# Patient Record
Sex: Female | Born: 2000 | Race: White | Hispanic: No | Marital: Single | Smoking: Never smoker
Health system: Southern US, Community
[De-identification: ages and names within clinical notes are randomized; demographics above are authoritative.]

## PROBLEM LIST (undated history)

## (undated) DIAGNOSIS — H539 Unspecified visual disturbance: Secondary | ICD-10-CM

## (undated) DIAGNOSIS — F410 Panic disorder [episodic paroxysmal anxiety] without agoraphobia: Secondary | ICD-10-CM

## (undated) DIAGNOSIS — F1910 Other psychoactive substance abuse, uncomplicated: Secondary | ICD-10-CM

## (undated) DIAGNOSIS — F419 Anxiety disorder, unspecified: Secondary | ICD-10-CM

## (undated) DIAGNOSIS — T7840XA Allergy, unspecified, initial encounter: Secondary | ICD-10-CM

## (undated) DIAGNOSIS — F32A Depression, unspecified: Secondary | ICD-10-CM

## (undated) DIAGNOSIS — F329 Major depressive disorder, single episode, unspecified: Secondary | ICD-10-CM

## (undated) DIAGNOSIS — Z68.41 Body mass index (BMI) pediatric, greater than or equal to 95th percentile for age: Secondary | ICD-10-CM

## (undated) HISTORY — DX: Anxiety disorder, unspecified: F41.9

## (undated) HISTORY — DX: Panic disorder (episodic paroxysmal anxiety): F41.0

## (undated) HISTORY — DX: Allergy, unspecified, initial encounter: T78.40XA

## (undated) HISTORY — PX: TONSILLECTOMY: SUR1361

## (undated) HISTORY — DX: Unspecified visual disturbance: H53.9

## (undated) HISTORY — DX: Body mass index (bmi) pediatric, greater than or equal to 95th percentile for age: Z68.54

---
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
## 2022-01-01 ENCOUNTER — Ambulatory Visit (INDEPENDENT_AMBULATORY_CARE_PROVIDER_SITE_OTHER): Payer: Medicaid Other

## 2022-01-01 ENCOUNTER — Ambulatory Visit
Admission: EM | Admit: 2022-01-01 | Discharge: 2022-01-01 | Disposition: A | Discharge status: Home / Self Care | Payer: Medicaid Other

## 2022-01-01 ENCOUNTER — Encounter: Payer: Self-pay | Admitting: Emergency Medicine

## 2022-01-01 DIAGNOSIS — S56911A Strain of unspecified muscles, fascia and tendons at forearm level, right arm, initial encounter: Secondary | ICD-10-CM

## 2022-01-01 DIAGNOSIS — M79601 Pain in right arm: Secondary | ICD-10-CM
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
## 2022-01-01 NOTE — Discharge Instructions (Addendum)
Your x-rays are negative for fracture or dislocation.   May take over-the-counter ibuprofen or Tylenol for pain or discomfort. RICE therapy rest, ice, compression, and elevation until your symptoms improve.  Apply ice for 20 minutes, remove for 1 hour, then repeat as often as possible.  This will help with pain and swelling. If symptoms do not improve within the next 2 to 4 weeks, recommend following up with orthopedics.  You can follow-up with [facility] at [phone] or [facility] in [place] at [phone].
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
## 2022-02-14 ENCOUNTER — Ambulatory Visit: Payer: Self-pay

## 2022-02-14 ENCOUNTER — Ambulatory Visit
Admission: EM | Admit: 2022-02-14 | Discharge: 2022-02-14 | Disposition: A | Discharge status: Home / Self Care | Payer: Medicaid Other | Attending: Family Medicine | Admitting: Family Medicine

## 2022-02-14 DIAGNOSIS — M79641 Pain in right hand: Secondary | ICD-10-CM

## 2022-02-14 MED ORDER — CYCLOBENZAPRINE HCL 10 MG PO TABS: 10.0000 mg | ORAL_TABLET | Freq: Three times a day (TID) | ORAL | 0 refills | Status: AC | PRN

## 2022-02-14 MED ORDER — PREDNISONE 20 MG PO TABS: 40.0000 mg | ORAL_TABLET | Freq: Every day | ORAL | 0 refills | Status: AC

## 2022-02-14 NOTE — ED Triage Notes (Signed)
Pt presents with pain in palm of right hand for past week, denies injury reports uses a lot at new job

## 2022-02-18 NOTE — ED Provider Notes (Signed)
[facility]    CSN: [id number] Arrival date & time: 02/14/22  1903      History   Chief Complaint Chief Complaint  Patient presents with   Hand Injury    HPI Dawn Pineda is a 21 y.o. female.   Patient presenting today with pain in her right palm for about a week now.  Denies any direct injury to the area but states she does a lot of repetitive movement with his hand at work.  Denies numbness, tingling, decreased range of motion, swelling, discoloration.  So far trying over-the-counter pain relievers with minimal relief.    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      Home Medications    Prior to Admission medications   Medication Sig Start Date End Date Taking? Authorizing Provider  cyclobenzaprine (FLEXERIL) 10 MG tablet Take 1 tablet (10 mg total) by mouth 3 (three) times daily as needed for muscle spasms. Do not drink alcohol or drive while taking this medication.  May cause drowsiness. 02/14/22  Yes Particia Nearing, PA-C  predniSONE (DELTASONE) 20 MG tablet Take 2 tablets (40 mg total) by mouth daily with breakfast. 02/14/22  Yes Particia Nearing, PA-C  amphetamine-dextroamphetamine (ADDERALL) 30 MG tablet Take 30 mg by mouth daily.    [provider]  escitalopram (LEXAPRO) 5 MG tablet Take 5 mg by mouth daily.    [provider]  ibuprofen (ADVIL,MOTRIN) 800 MG tablet Take 1  tablet (800 mg total) by mouth 3 (three) times daily. Patient not taking: Reported on 07/08/2015 01/14/15   Burgess Amor, PA-C    Family History Family History  Problem Relation Age of Onset   Alcohol abuse Mother    ADD / ADHD Father    Alcohol abuse Father    ADD / ADHD Brother    Asthma Brother    Cancer Maternal Aunt    Melanoma Maternal Aunt        died at age 44   COPD Maternal Grandmother    Diabetes Paternal Grandmother    Hyperlipidemia Paternal Grandmother    Diabetes Paternal Grandfather    Hypertension Paternal Grandfather    Hyperlipidemia Paternal Grandfather     Social History Social History   Tobacco Use   Smoking status: Never  Substance Use Topics   Alcohol use: No   Drug use: Yes    Types: Marijuana    Comment: hx of cough syrup/decongestant use as child     Allergies   Eye drops [tetrahydrozoline]   Review of Systems Review of Systems Per HPI  Physical Exam Triage Vital Signs ED Triage Vitals [02/14/22 1951]  Enc Vitals Group     BP 118/80     Pulse Rate 73     Resp 16     Temp (!) 97.5 F (36.4 C)  Temp Source Oral     SpO2 99 %     Weight      Height      Head Circumference      Peak Flow      Pain Score 8     Pain Loc      Pain Edu?      Excl. in GC?    No data found.  Updated Vital Signs BP 118/80 (BP Location: Right Arm)   Pulse 73   Temp (!) 97.5 F (36.4 C) (Oral)   Resp 16   SpO2 99%   Visual Acuity Right Eye Distance:   Left Eye Distance:   Bilateral Distance:    Right Eye Near:   Left Eye Near:    Bilateral Near:     Physical Exam Vitals and nursing note reviewed.  Constitutional:      Appearance: Normal appearance. She is not ill-appearing.  HENT:     Head: Atraumatic.  Eyes:     Extraocular Movements: Extraocular movements intact.     Conjunctiva/sclera: Conjunctivae normal.  Cardiovascular:     Rate and Rhythm: Normal rate and regular rhythm.     Heart sounds: Normal heart sounds.   Pulmonary:     Effort: Pulmonary effort is normal.     Breath sounds: Normal breath sounds.  Musculoskeletal:        General: Tenderness present. No swelling, deformity or signs of injury. Normal range of motion.     Cervical back: Normal range of motion and neck supple.     Comments: Localized area of tenderness to palpation below third and fourth digits right hand.  No bony deformity palpable, range of motion full and intact  Skin:    General: Skin is warm and dry.  Neurological:     Mental Status: She is alert and oriented to person, place, and time.     Motor: No weakness.     Gait: Gait normal.     Comments: Right hand neurovascularly intact  Psychiatric:        Mood and Affect: Mood normal.        Thought Content: Thought content normal.        Judgment: Judgment normal.      UC Treatments / Results  Labs (all labs ordered are listed, but only abnormal results are displayed) Labs Reviewed - No data to display  EKG   Radiology No results found.  Procedures Procedures (including critical care time)  Medications Ordered in UC Medications - No data to display  Initial Impression / Assessment and Plan / UC Course  I have reviewed the triage vital signs and the nursing notes.  Pertinent labs & imaging results that were available during my care of the patient were reviewed by me and considered in my medical decision making (see chart for details).     Suspect a tendinitis type issue, treat with prednisone, Flexeril, rest, Epsom salt soaks, over-the-counter pain relievers.  X-ray imaging deferred with shared decision making given no injury to the area and no deformities on palpation, range of motion being intact.  Work note given for rest.  Return for any worsening symptoms.  Final Clinical Impressions(s) / UC Diagnoses   Final diagnoses:  Right hand pain   Discharge Instructions   None    ED Prescriptions     Medication Sig Dispense Auth. Provider    predniSONE (DELTASONE) 20 MG tablet Take 2 tablets (40 mg total) by mouth daily with breakfast. 6  tablet Particia Nearing, PA-C   cyclobenzaprine (FLEXERIL) 10 MG tablet Take 1 tablet (10 mg total) by mouth 3 (three) times daily as needed for muscle spasms. Do not drink alcohol or drive while taking this medication.  May cause drowsiness. 15 tablet Particia Nearing, [place]      PDMP not reviewed this encounter.   Particia Nearing, [place] 02/18/22 1948

## 2022-06-09 ENCOUNTER — Ambulatory Visit: Admit: 2022-06-09 | Payer: Medicaid Other

## 2024-02-14 ENCOUNTER — Ambulatory Visit (HOSPITAL_COMMUNITY): Payer: Self-pay

## 2024-03-17 ENCOUNTER — Other Ambulatory Visit: Payer: Self-pay

## 2024-03-17 ENCOUNTER — Ambulatory Visit (INDEPENDENT_AMBULATORY_CARE_PROVIDER_SITE_OTHER): Admitting: Radiology

## 2024-03-17 ENCOUNTER — Ambulatory Visit
Admission: RE | Admit: 2024-03-17 | Discharge: 2024-03-17 | Disposition: A | Discharge status: Home / Self Care | Attending: Physician Assistant | Admitting: Physician Assistant

## 2024-03-17 VITALS — BP 110/80 | HR 77 | Temp 98.2°F | Resp 19 | Ht 64.0 in | Wt 310.8 lb

## 2024-03-17 DIAGNOSIS — M79661 Pain in right lower leg: Secondary | ICD-10-CM

## 2024-03-17 DIAGNOSIS — S80811A Abrasion, right lower leg, initial encounter: Secondary | ICD-10-CM

## 2024-03-17 MED ORDER — MUPIROCIN 2 % EX OINT: 1.0000 | TOPICAL_OINTMENT | Freq: Two times a day (BID) | CUTANEOUS | 0 refills | Status: AC

## 2024-03-17 NOTE — ED Provider Notes (Signed)
 [facility]    CSN: [id number] Arrival date & time: 03/17/24  1440      History   Chief Complaint Chief Complaint  Patient presents with   Leg Injury    Entered by patient    HPI Dawn Pineda is a 23 y.o. female.  has a past medical history of Allergy, Anxiety, BMI (body mass index), pediatric, greater than 99% for age (04/15/2014), Depression, Panic attacks (04/15/2014), Substance abuse (HCC), and Vision abnormalities.   HPI  Discussed the use of AI scribe software for clinical note transcription with the patient, who gave verbal consent to proceed.  The patient presents with persistent knee pain and swelling following a fall one week ago. She is accompanied by a concerned family member.  She fell a week ago, tripping over a single step in a poorly lit area. Since the fall, she has experienced persistent pain and swelling in her knee, which worsens with walking and causes a limp. She describes a 'weird lump' and pain that 'shoots into my ankle' when walking.  The swelling has been consistent, particularly around the knee, and was notably swollen the previous night. She has difficulty bending her knee beyond a certain point due to tightness, although this has slightly improved. The pain is primarily from pulling when attempting to bend the knee, and it is sore when pressed on the sides.  She is concerned about the possibility of a hairline fracture due to the persistent pain, especially after standing all day, which causes significant discomfort. She is anxious about the injury as she is planning a trip next week.  She mentions that the area of the injury tends to 'ooze' and 'split,' with greenish discharge, prompting concern about infection. She has been experiencing pain significant enough to cause crying at night.  No pain in the ankle itself. Difficulty bending the knee due to tightness.   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      Home Medications    Prior to Admission medications   Medication Sig Start Date End Date Taking? Authorizing Provider  mupirocin  ointment (BACTROBAN ) 2 % Apply 1 Application topically 2 (two) times daily for 7 days. 03/17/24 03/24/24 Yes Jeferson Boozer E, PA-C  amphetamine-dextroamphetamine (ADDERALL) 30 MG tablet Take 30 mg by mouth daily.    [provider]  cyclobenzaprine  (FLEXERIL ) 10 MG tablet Take 1 tablet (10 mg total) by mouth 3 (three) times daily as needed for muscle spasms. Do not drink alcohol or drive while taking this medication.  May cause drowsiness. 02/14/22   Stuart Vernell Norris, PA-C  escitalopram (LEXAPRO) 5 MG tablet Take 5 mg by mouth daily.    [provider]  ibuprofen  (ADVIL ,MOTRIN ) 800 MG tablet Take 1 tablet (800 mg total) by mouth 3 (three) times daily. Patient not taking: Reported on 07/08/2015 01/14/15   Idol,  Mliss, PA-C  predniSONE  (DELTASONE ) 20 MG tablet Take 2 tablets (40 mg total) by mouth daily with breakfast. 02/14/22   Stuart Vernell Norris, PA-C    Family History Family History  Problem Relation Age of Onset   Alcohol abuse Mother    ADD / ADHD Father    Alcohol abuse Father    ADD / ADHD Brother    Asthma Brother    Cancer Maternal Aunt    Melanoma Maternal Aunt        died at age 55   COPD Maternal Grandmother    Diabetes Paternal Grandmother    Hyperlipidemia Paternal Grandmother    Diabetes Paternal  Grandfather    Hypertension Paternal Grandfather    Hyperlipidemia Paternal Grandfather     Social History Social History   Tobacco Use   Smoking status: Never   Smokeless tobacco: Never  Vaping Use   Vaping status: Every Day  Substance Use Topics   Alcohol use: No   Drug use: Yes    Types: Marijuana    Comment: hx of cough syrup/decongestant use as child     Allergies   Eye drops [tetrahydrozoline] and Gentamicin   Review of Systems Review of Systems  Musculoskeletal:  Positive for myalgias.       Right lower leg pain with radiation to right ankle    Skin:  Positive for wound.     Physical Exam Triage Vital Signs ED Triage Vitals  Encounter Vitals Group     BP 03/17/24 1501 110/80     Girls Systolic BP Percentile --      Girls Diastolic BP Percentile --      Boys Systolic BP Percentile --      Boys Diastolic BP Percentile --      Pulse Rate 03/17/24 1501 77     Resp 03/17/24 1501 19     Temp 03/17/24 1501 98.2 F (36.8 C)     Temp Source 03/17/24 1501 Oral     SpO2 03/17/24 1501 95 %     Weight 03/17/24 1458 (!) 310 lb 13.6 oz (141 kg)     Height 03/17/24 1458 5' 4 (1.626 m)     Head Circumference --      Peak Flow --      Pain Score 03/17/24 1458 3     Pain Loc --      Pain Education --      Exclude from Growth Chart --    No data found.  Updated Vital Signs BP 110/80 (BP Location: Right Arm)   Pulse 77   Temp 98.2 F (36.8 C) (Oral)   Resp 19   Ht 5' 4 (1.626 m)   Wt (!) 310 lb 13.6 oz (141 kg)   LMP 03/11/2024 (Approximate)   SpO2 95%   BMI 53.36 kg/m   Visual Acuity Right Eye Distance:   Left Eye Distance:   Bilateral Distance:    Right Eye Near:   Left Eye Near:    Bilateral Near:     Physical Exam Vitals reviewed.  Constitutional:      General: She is awake.     Appearance: Normal appearance. She is well-developed and well-groomed.  HENT:     Head: Normocephalic and atraumatic.  Eyes:     General: Lids are normal. Gaze  aligned appropriately.     Extraocular Movements: Extraocular movements intact.     Conjunctiva/sclera: Conjunctivae normal.  Pulmonary:     Effort: Pulmonary effort is normal.  Musculoskeletal:     Right knee: Erythema and ecchymosis present. No swelling or effusion. Normal range of motion. No tenderness. No LCL laxity, MCL laxity, ACL laxity or PCL laxity.     Instability Tests: Anterior drawer test negative. Posterior drawer test negative.       Legs:  Neurological:     Mental Status: She is alert and oriented to person, place, and time.  Psychiatric:        Attention and Perception: Attention and perception normal.        Mood and Affect: Mood and affect normal.        Speech: Speech normal.        Behavior: Behavior normal. Behavior is cooperative.      UC Treatments / Results  Labs (all labs ordered are listed, but only abnormal results are displayed) Labs Reviewed - No data to display  EKG   Radiology DG Tibia/Fibula Right Result Date: 03/17/2024 CLINICAL DATA:  Fall with persistent pain and bruising. EXAM: RIGHT TIBIA AND FIBULA - 2 VIEW COMPARISON:  None Available. FINDINGS: There is no evidence of fracture or other focal bone lesions. Soft tissues are unremarkable. IMPRESSION: Negative. Electronically Signed   By: Greig Pique M.D.   On: 03/17/2024 15:47    Procedures Procedures (including critical care time)  Medications Ordered in UC Medications - No data to display  Initial Impression / Assessment and Plan / UC Course  I have reviewed the triage vital signs and the nursing notes.  Pertinent labs & imaging results that were available during my care of the patient were reviewed by me and considered in my medical decision making (see chart for details).      Final Clinical Impressions(s) / UC Diagnoses   Final diagnoses:  Abrasion of right leg, initial encounter  Pain in right lower leg   Lower leg soft tissue injury following a fall one week ago.  Persistent swelling and pain, especially when walking, with a lump present. Pain radiates to the ankle, causing a limp. No significant bruising or excruciating pain. Differential includes soft tissue damage and possible hairline fracture, though the latter is less likely given the current presentation. - Apply mupirocin  ointment twice daily for seven days - Use Aquaphor or Vaseline to keep the area moisturized - Take acetaminophen  or ibuprofen  as needed for pain - Apply warm compresses to the affected area - Perform gentle stretches and exercises to prevent stiffness - Order x-ray of the lower leg- negative for evidence of fracture or dislocation per radiology interpretation.  - Follow up with orthopedics if symptoms persist or worsen  Anxiety about injury Anxiety regarding the injury, particularly due to upcoming travel plans and persistent pain. Concerns about potential hairline fracture and long-term healing process. Reassurance provided regarding the healing process and expected time course. X-ray ordered to provide peace of mind and rule out fracture. - Reassure regarding the healing process and expected time course - Discuss potential outcomes and follow-up plan if symptoms persist - Provide peace of mind with x-ray to rule out fracture     Discharge Instructions      You came in today due to persistent knee pain and swelling following a fall one week ago. You are experiencing pain that worsens with walking and causes a limp, along with a 'weird lump' and pain that shoots into your ankle. You also mentioned concerns about a possible hairline fracture and an upcoming trip. Additionally, you have noticed some discharge from the injury site,  which has caused you concern about infection.  YOUR PLAN:  -LOWER LEG SOFT TISSUE INJURY: A lower leg soft tissue injury means that the muscles, ligaments, or tendons in your lower leg have been damaged. This type of injury can cause pain, swelling,  and difficulty moving the affected area. To help with healing, apply mupirocin  ointment twice daily for seven days, use Aquaphor or Vaseline to keep the area moisturized, take acetaminophen  or ibuprofen  as needed for pain, apply warm compresses to the affected area, and perform gentle stretches and exercises to prevent stiffness. An x-ray of your lower leg has been ordered to rule out any fractures, it was negative for evidence of fracture or dislocation per radiology interpretation . Follow up with orthopedics if your symptoms persist or worsen.  INSTRUCTIONS:  Please get an x-ray of your lower leg as soon as possible. Follow up with orthopedics if your symptoms persist or worsen. Continue to apply mupirocin  ointment twice daily for seven days, use Aquaphor or Vaseline to keep the area moisturized, take acetaminophen  or ibuprofen  as needed for pain, apply warm compresses to the affected area, and perform gentle stretches and exercises to prevent stiffness.     ED Prescriptions     Medication Sig Dispense Auth. Provider   mupirocin  ointment (BACTROBAN ) 2 % Apply 1 Application topically 2 (two) times daily for 7 days. 22 g Juliet Vasbinder E, PA-C      PDMP not reviewed this encounter.   Marylene Rocky BRAVO, PA-C 03/17/24 1605

## 2024-03-17 NOTE — ED Triage Notes (Signed)
 Pt presents with a chief complaint of right leg injury. States one week ago, she tripped and fell on concrete. Does endorse hitting head however denies LOC. Pain increases with ambulation. Currently rates pain a 3/10 at this time, sitting in triage room. Neosporin applied to bruise on right leg (near the knee area) with some noticeable improvement.

## 2024-03-17 NOTE — Discharge Instructions (Signed)
 You came in today due to persistent knee pain and swelling following a fall one week ago. You are experiencing pain that worsens with walking and causes a limp, along with a 'weird lump' and pain that shoots into your ankle. You also mentioned concerns about a possible hairline fracture and an upcoming trip. Additionally, you have noticed some discharge from the injury site, which has caused you concern about infection.  YOUR PLAN:  -LOWER LEG SOFT TISSUE INJURY: A lower leg soft tissue injury means that the muscles, ligaments, or tendons in your lower leg have been damaged. This type of injury can cause pain, swelling, and difficulty moving the affected area. To help with healing, apply mupirocin  ointment twice daily for seven days, use Aquaphor or Vaseline to keep the area moisturized, take acetaminophen  or ibuprofen  as needed for pain, apply warm compresses to the affected area, and perform gentle stretches and exercises to prevent stiffness. An x-ray of your lower leg has been ordered to rule out any fractures, it was negative for evidence of fracture or dislocation per radiology interpretation . Follow up with orthopedics if your symptoms persist or worsen.  INSTRUCTIONS:  Please get an x-ray of your lower leg as soon as possible. Follow up with orthopedics if your symptoms persist or worsen. Continue to apply mupirocin  ointment twice daily for seven days, use Aquaphor or Vaseline to keep the area moisturized, take acetaminophen  or ibuprofen  as needed for pain, apply warm compresses to the affected area, and perform gentle stretches and exercises to prevent stiffness.
